# Patient Record
Sex: Female | Born: 1955 | Race: White | Hispanic: No | Marital: Single | Smoking: Former smoker
Health system: Southern US, Community
[De-identification: ages and names within clinical notes are randomized; demographics above are authoritative.]

## PROBLEM LIST (undated history)

## (undated) DIAGNOSIS — J449 Chronic obstructive pulmonary disease, unspecified: Secondary | ICD-10-CM

## (undated) HISTORY — PX: APPENDECTOMY: SHX54

---

## 2010-03-01 ENCOUNTER — Encounter: Payer: Self-pay | Admitting: Pulmonary Disease

## 2010-03-29 ENCOUNTER — Ambulatory Visit: Payer: Self-pay | Admitting: Pulmonary Disease

## 2010-03-29 DIAGNOSIS — E785 Hyperlipidemia, unspecified: Secondary | ICD-10-CM

## 2010-03-29 DIAGNOSIS — J4489 Other specified chronic obstructive pulmonary disease: Secondary | ICD-10-CM | POA: Insufficient documentation

## 2010-03-29 DIAGNOSIS — J209 Acute bronchitis, unspecified: Secondary | ICD-10-CM

## 2010-03-29 DIAGNOSIS — J449 Chronic obstructive pulmonary disease, unspecified: Secondary | ICD-10-CM

## 2010-03-29 DIAGNOSIS — J45909 Unspecified asthma, uncomplicated: Secondary | ICD-10-CM | POA: Insufficient documentation

## 2010-03-29 DIAGNOSIS — E119 Type 2 diabetes mellitus without complications: Secondary | ICD-10-CM

## 2010-11-22 NOTE — Assessment & Plan Note (Signed)
Summary: Cough, sob, restrictive lung ds./in [facility] office/apc   Visit Type:  Initial Consult Copy to:  PCP Primary Provider/Referring Provider:  Dr. [facility]  CC:  Pt c/o productive cough with dark green mucus first thing in the AM, thick and clear after taking neb treatment, and once starts to cough pt has to continue to cough due to chest congestion.  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 Onset was with URI symptoms progressing to chest congestion, dyspnea & chest tightness. She had 5 urgent care visits -PF noted to be 100, spirometry showed FEv of 31% - told that she had COPD, augmentin, solumedrol 125 mg IM given & finally starfted on xopenex & atrovent nebs. Metformin controls her sugars -125 range. She now has clear phlegm, sometmes green in am, nasal stufiness, residual cough & wheezing. labs showed WC 7.1, Hb 12.4 CXR 03/01/10 mild hyperinflation, no infx (reviewed images) She denies seasonal allergies.  Preventive Screening-Counseling & Management  Alcohol-Tobacco     Smoking Status: quit     Packs/Day: 0.25     Year Started: 1972     Year Quit: 1999  Current Medications (verified): 1)  Aspirin 81 Mg  Tabs (Aspirin) .... Take 1 Tablet By Mouth Once A Day 2)  Lipitor 20 Mg Tabs (Atorvastatin Calcium) .... Take 1 Tablet By Mouth Once A Day 3)  Metformin Hcl 500 Mg Tabs (Metformin Hcl) .... Take 1 Tab By Mouth At Bedtime 4)  Xopenex 1.25 Mg/78ml Nebu (Levalbuterol Hcl) .... Inhale 1 Vial Via Nebulizer Every 6 Hours 5)  Ipratropium Bromide 0.02 % Soln (Ipratropium Bromide) .... Inhale 1 Vial Via Nebulizer Every 6 Hours  Allergies (verified): No Known Drug Allergies  Past History:  Past Medical History: HYPERLIPIDEMIA (ICD-272.4) DIABETES, TYPE 2 (ICD-250.00) C O P D (ICD-496) ASTHMA (ICD-493.90)    Past Surgical History: none  Family History: Family History Asthma-son Family  History MI/Heart Attack-mother Family History Rheumatoid Arthritis-mother Cancer-paternal  Social History: Marital Status: divorced, lives with daughter Children: yes Occupation: Runner, broadcasting/film/video Patient states former smoker. - quit '99, smoked socially Smoking Status:  quit Packs/Day:  0.25  Review of Systems       The patient complains of shortness of breath with activity, productive cough, indigestion, weight change, headaches, anxiety, depression, joint stiffness or pain, and change in color of mucus.  The patient denies shortness of breath at rest, non-productive cough, coughing up blood, chest pain, irregular heartbeats, acid heartburn, loss of appetite, abdominal pain, difficulty swallowing, sore throat, tooth/dental problems, nasal congestion/difficulty breathing through nose, sneezing, itching, ear ache, hand/feet swelling, rash, and fever.    Vital Signs:  Patient profile:   55 year old female Height:      62 inches Weight:      158 pounds BMI:     29.00 O2 Sat:      93 % Temp:     98.1 degrees F oral Pulse rate:   94 / minute BP sitting:   118 / 86  (left arm) Cuff size:   regular  Vitals Entered By: Zackery Barefoot CMA (March 29, 2010 10:37 AM) CC: Pt c/o productive cough with dark green mucus first thing in the AM, thick and clear after taking neb treatment, once starts to cough pt has to continue to cough due to chest congestion   Physical Exam  Additional Exam:  Gen. Pleasant, well-nourished, in no distress, normal affect, intermittent coughing ENT - no lesions,  no post nasal drip Neck: No JVD, no thyromegaly, no carotid bruits Lungs: no use of accessory muscles, no dullness to percussion, clear without rales or rhonchi  Cardiovascular: Rhythm regular, heart sounds  normal, no murmurs or gallops, no peripheral edema Abdomen: soft and non-tender, no hepatosplenomegaly, BS normal. Musculoskeletal: No deformities, no cyanosis or clubbing Neuro:  alert, non focal      Impression & Recommendations:  Problem # 1:  BRONCHITIS, ACUTE (ICD-466.0)  Likely post bronchitic cough & wheezing. No need for more ABx. Short course of steroids- advised CBG monitoring. ct xopenex/ atrovent nebs q 6h as needed   Her updated medication list for this problem includes:    Xopenex 1.25 Mg/86ml Nebu (Levalbuterol hcl) ..... Inhale 1 vial via nebulizer every 6 hours    Ipratropium Bromide 0.02 % Soln (Ipratropium bromide) ..... 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 Would be interesting to repeat spirometry a few months after symptom resolution to check FEV1.  Medications Added to Medication List This Visit: 1)  Aspirin 81 Mg Tabs (Aspirin) .... Take 1 tablet by mouth once a day 2)  Lipitor 20 Mg Tabs (Atorvastatin calcium) .... Take 1 tablet by mouth once a day 3)  Metformin Hcl 500 Mg Tabs (Metformin hcl) .... Take 1 tab by mouth at bedtime 4)  Xopenex 1.25 Mg/34ml Nebu (Levalbuterol hcl) .... Inhale 1 vial via nebulizer every 6 hours 5)  Ipratropium Bromide 0.02 % Soln (Ipratropium bromide) .... Inhale 1 vial via nebulizer every 6 hours 6)  Prednisone 10 Mg Tabs (Prednisone) .... Take 2 tabs once daily with food x 5 days , then 1 tab x 7 days then stop  Patient Instructions: 1)  Copy sent to: dr amin/ [facility] [place] 2)  Please schedule a follow-up appointment in 3 months. 3)  Call if sugars higher than 200 or if no better in 3 weeks 4)  Use claritin-D OR afrin nasal spray (decongestant) Prescriptions: PREDNISONE 10 MG TABS (PREDNISONE) take 2 tabs once daily with food x 5 days , then 1 tab x 7 days then STOP  #20 x 0   Entered and Authorized by:   Comer Locket Vassie Loll MD   Signed by:   Comer Locket Vassie Loll MD on 03/29/2010   Method used:   Electronically to        [id number]* (retail)       [address]       [place], [place]  [address]       Ph: [phone]       Fax: [phone]   RxID:   [id number]

## 2012-02-01 ENCOUNTER — Emergency Department (INDEPENDENT_AMBULATORY_CARE_PROVIDER_SITE_OTHER): Payer: Managed Care, Other (non HMO)

## 2012-02-01 ENCOUNTER — Emergency Department (HOSPITAL_BASED_OUTPATIENT_CLINIC_OR_DEPARTMENT_OTHER): Payer: Managed Care, Other (non HMO)

## 2012-02-01 ENCOUNTER — Encounter (HOSPITAL_BASED_OUTPATIENT_CLINIC_OR_DEPARTMENT_OTHER): Payer: Self-pay | Admitting: Emergency Medicine

## 2012-02-01 ENCOUNTER — Emergency Department (HOSPITAL_BASED_OUTPATIENT_CLINIC_OR_DEPARTMENT_OTHER)
Admission: EM | Admit: 2012-02-01 | Discharge: 2012-02-01 | Disposition: A | Discharge status: Home / Self Care | Payer: Managed Care, Other (non HMO) | Attending: Emergency Medicine | Admitting: Emergency Medicine

## 2012-02-01 DIAGNOSIS — R0602 Shortness of breath: Secondary | ICD-10-CM

## 2012-02-01 DIAGNOSIS — M549 Dorsalgia, unspecified: Secondary | ICD-10-CM

## 2012-02-01 DIAGNOSIS — K8020 Calculus of gallbladder without cholecystitis without obstruction: Secondary | ICD-10-CM

## 2012-02-01 DIAGNOSIS — R21 Rash and other nonspecific skin eruption: Secondary | ICD-10-CM | POA: Insufficient documentation

## 2012-02-01 DIAGNOSIS — R079 Chest pain, unspecified: Secondary | ICD-10-CM

## 2012-02-01 DIAGNOSIS — R42 Dizziness and giddiness: Secondary | ICD-10-CM | POA: Insufficient documentation

## 2012-02-01 DIAGNOSIS — Z79899 Other long term (current) drug therapy: Secondary | ICD-10-CM | POA: Insufficient documentation

## 2012-02-01 DIAGNOSIS — R918 Other nonspecific abnormal finding of lung field: Secondary | ICD-10-CM

## 2012-02-01 DIAGNOSIS — I959 Hypotension, unspecified: Secondary | ICD-10-CM

## 2012-02-01 DIAGNOSIS — K5730 Diverticulosis of large intestine without perforation or abscess without bleeding: Secondary | ICD-10-CM

## 2012-02-01 DIAGNOSIS — E119 Type 2 diabetes mellitus without complications: Secondary | ICD-10-CM | POA: Insufficient documentation

## 2012-02-01 DIAGNOSIS — J4489 Other specified chronic obstructive pulmonary disease: Secondary | ICD-10-CM | POA: Insufficient documentation

## 2012-02-01 DIAGNOSIS — J449 Chronic obstructive pulmonary disease, unspecified: Secondary | ICD-10-CM | POA: Insufficient documentation

## 2012-02-01 HISTORY — DX: Chronic obstructive pulmonary disease, unspecified: J44.9

## 2012-02-01 LAB — CBC
HCT: 37.5 % (ref 36.0–46.0)
MCHC: 33.3 g/dL (ref 30.0–36.0)
Platelets: 214 10*3/uL (ref 150–400)
RDW: 12.3 % (ref 11.5–15.5)
WBC: 12.1 10*3/uL — ABNORMAL HIGH (ref 4.0–10.5)

## 2012-02-01 LAB — BASIC METABOLIC PANEL
Chloride: 102 mEq/L (ref 96–112)
GFR calc Af Amer: >90 mL/min (ref >90)
GFR calc non Af Amer: 80 mL/min — ABNORMAL LOW (ref >90)
Potassium: 3.8 mEq/L (ref 3.5–5.1)
Sodium: 138 mEq/L (ref 135–145)

## 2012-02-01 LAB — TROPONIN I
Troponin I: <0.3 ng/mL (ref <0.30)
Troponin I: <0.3 ng/mL (ref <0.30)

## 2012-02-01 LAB — HEPATIC FUNCTION PANEL
AST: 54 U/L — ABNORMAL HIGH (ref 0–37)
Bilirubin, Direct: 0.2 mg/dL (ref 0.0–0.3)
Indirect Bilirubin: 0.3 mg/dL (ref 0.3–0.9)
Total Bilirubin: 0.5 mg/dL (ref 0.3–1.2)

## 2012-02-01 LAB — LIPASE, BLOOD: Lipase: 19 U/L (ref 11–59)

## 2012-02-01 LAB — GLUCOSE, CAPILLARY: Glucose-Capillary: 112 mg/dL — ABNORMAL HIGH (ref 70–99)

## 2012-02-01 MED ORDER — FENTANYL CITRATE 0.05 MG/ML IJ SOLN
50.0000 ug | Freq: Once | INTRAMUSCULAR | Status: AC
  Administered 2012-02-01: 50 ug via INTRAVENOUS
  Filled 2012-02-01: qty 2

## 2012-02-01 MED ORDER — IOHEXOL 350 MG/ML SOLN
100.0000 mL | Freq: Once | INTRAVENOUS | Status: AC | PRN
  Administered 2012-02-01: 100 mL via INTRAVENOUS

## 2012-02-01 MED ORDER — SODIUM CHLORIDE 0.9 % IV BOLUS (SEPSIS)
1000.0000 mL | Freq: Once | INTRAVENOUS | Status: AC
  Administered 2012-02-01: 1000 mL via INTRAVENOUS

## 2012-02-01 MED ORDER — MORPHINE SULFATE 4 MG/ML IJ SOLN
4.0000 mg | Freq: Once | INTRAMUSCULAR | Status: AC
  Administered 2012-02-01: 4 mg via INTRAVENOUS
  Filled 2012-02-01: qty 1

## 2012-02-01 MED ORDER — KETOROLAC TROMETHAMINE 30 MG/ML IJ SOLN
30.0000 mg | Freq: Once | INTRAMUSCULAR | Status: AC
  Administered 2012-02-01: 30 mg via INTRAVENOUS
  Filled 2012-02-01: qty 1

## 2012-02-01 MED ORDER — IBUPROFEN 600 MG PO TABS: 600.0000 mg | ORAL_TABLET | Freq: Three times a day (TID) | ORAL | Status: AC | PRN

## 2012-02-01 MED ORDER — OXYCODONE-ACETAMINOPHEN 5-325 MG PO TABS: 1.0000 | ORAL_TABLET | ORAL | Status: AC | PRN

## 2012-02-01 NOTE — ED Notes (Signed)
Pt returned from ct

## 2012-02-01 NOTE — ED Notes (Signed)
Pt ambulated in hallway without difficulty

## 2012-02-01 NOTE — ED Notes (Signed)
MD at bedside. 

## 2012-02-01 NOTE — ED Notes (Signed)
Patient transported to CT 1030

## 2012-02-01 NOTE — ED Notes (Addendum)
Pt c/o of mid sternal chest pain that began at 0345- States pain is midsternal,non-radiating in nature. C/o of SOB with some dizziness Pain increases with pressure applied to chest

## 2012-02-01 NOTE — ED Provider Notes (Signed)
History     CSN: [id number]  Arrival date & time 02/01/12  1308   First MD Initiated Contact with Patient 02/01/12 [phone]      Chief Complaint  Patient presents with  . Chest Pain     The history is provided by the patient and a relative.   the patient reports she began having chest pain that was substernal and radiated to her upper back at approximately 3:45 AM today.  The pain has been severe, midsternal, 10 out of 10 as cause some shortness of breath.  The pain has been constant.  She denies cough or congestion, fever or chills.  She denies unilateral leg or arm swelling.  She's had no recent long travel.  She has no prior history of coronary artery disease, DVT, pulmonary embolism.  Family reported she appeared pale at home.  She denies arm or jaw pain.  Her pain is worse with deep breathing and palpation of her chest.  She denies vomiting or diarrhea.  She denies abdominal pain.  She's never had pain similar to this before.  She does have a history of COPD and diabetes.  She reports this does not feel like her COPD exacerbations. her pain is severe at this time  Past Medical History  Diagnosis Date  . Diabetes mellitus   . COPD (chronic obstructive pulmonary disease)     Past Surgical History  Procedure Date  . Appendectomy     No family history on file.  History  Substance Use Topics  . Smoking status: Not on file  . Smokeless tobacco: Not on file  . Alcohol Use:     OB History    Grav Para Term Preterm Abortions TAB SAB Ect Mult Living                  Review of Systems  Cardiovascular: Positive for chest pain.  All other systems reviewed and are negative.    Allergies  Review of patient's allergies indicates no known allergies.  Home Medications   Current Outpatient Rx  Name Route Sig Dispense Refill  . ACETAMINOPHEN 500 MG PO TABS Oral Take 500 mg by mouth every 6 (six) hours as needed. Leg pain    . ALBUTEROL SULFATE HFA 108 (90 BASE) MCG/ACT IN AERS  Inhalation Inhale 2 puffs into the lungs every 6 (six) hours as needed. For wheezing    . ASPIRIN 81 MG PO TABS Oral Take 81 mg by mouth daily.    . ASPIRIN EFFERVESCENT 325 MG PO TBEF Oral Take 325 mg by mouth every 6 (six) hours as needed.    . ATORVASTATIN CALCIUM 20 MG PO TABS Oral Take 20 mg by mouth daily.    Marland Kitchen METFORMIN HCL ER (OSM) 500 MG PO TB24 Oral Take 500 mg by mouth daily.     Marland Kitchen PHENTERMINE HCL 30 MG PO CAPS Oral Take 30 mg by mouth every morning.    Marland Kitchen TIOTROPIUM BROMIDE MONOHYDRATE 18 MCG IN CAPS Inhalation Place 18 mcg into inhaler and inhale daily.      BP 91/43  Pulse 78  Resp 16  Ht 5\' 4"  (1.626 m)  Wt 130 lb (58.968 kg)  BMI 22.31 kg/m2  SpO2 98%  Physical Exam  Nursing note and vitals reviewed. Constitutional: She is oriented to person, place, and time. She appears well-developed and well-nourished. No distress.  HENT:  Head: Normocephalic and atraumatic.  Eyes: EOM are normal.  Neck: Normal range of motion.  Cardiovascular: Normal  rate, regular rhythm and normal heart sounds.   Pulmonary/Chest: Effort normal and breath sounds normal.  Abdominal: Soft. She exhibits no distension. There is no tenderness.  Musculoskeletal: Normal range of motion.  Neurological: She is alert and oriented to person, place, and time.  Skin: Skin is warm and dry.       There is a blanching rash to her upper abdomen without vesicles or surrounding erythema.  This involves both the right upper quadrant epigastric and left upper quadrant areas  Psychiatric: She has a normal mood and affect. Judgment normal.    ED Course  Procedures (including critical care time)   Date: 02/01/2012 0859  Rate: 60  Rhythm: normal sinus rhythm  QRS Axis: normal  Intervals: normal  ST/T Wave abnormalities: Nonspecific T wave changes  Conduction Disutrbances: none  Narrative Interpretation:   Old EKG Reviewed: No prior EKG available   Date: 02/01/2012 1405  Rate: 78  Rhythm: normal sinus  rhythm  QRS Axis: normal  Intervals: normal  ST/T Wave abnormalities: Nonspecific T wave changes   Conduction Disutrbances: none  Narrative Interpretation:   Old EKG Reviewed: No significant changes noted      Labs Reviewed  GLUCOSE, CAPILLARY - Abnormal; Notable for the following:    Glucose-Capillary 112 (*)    All other components within normal limits  CBC - Abnormal; Notable for the following:    WBC 12.1 (*)    All other components within normal limits  BASIC METABOLIC PANEL - Abnormal; Notable for the following:    Glucose, Bld 113 (*)    GFR calc non Af Amer 80 (*)    All other components within normal limits  HEPATIC FUNCTION PANEL - Abnormal; Notable for the following:    AST 54 (*)    All other components within normal limits  TROPONIN I  D-DIMER, QUANTITATIVE  TROPONIN I  LIPASE, BLOOD   Ct Angio Chest W/cm &/or Wo Cm  02/01/2012  *RADIOLOGY REPORT*  Clinical Data:  Chest pain, hypotensive  CT ANGIOGRAPHY CHEST, ABDOMEN AND PELVIS  Technique:  Multidetector CT imaging through the chest, abdomen and pelvis was performed using the standard protocol during bolus administration of intravenous contrast.  Multiplanar reconstructed images including MIPs were obtained and reviewed to evaluate the vascular anatomy.  Contrast: OMNIPAQUE IOHEXOL 350 MG/ML SOLN,  Comparison:   None.  CTA CHEST  Findings:  The noncontrast scan shows no hyperdense hematoma. There is good contrast opacification of the pulmonary artery branches. No discrete filling defect to suggest acute PE.There is good contrast opacification of the thoracic aorta, without evidence of dissection, aneurysm, or stenosis.  There is a common origin of the right brachiocephalic and left common carotid arteries, a normal variant.  No proximal brachiocephalic arterial origin stenosis.  No pleural or pericardial effusion.  No hilar or mediastinal adenopathy.  Sub centimeter prevascular and AP window lymph nodes are noted.   Emphysematous changes are noted in the upper lobes. There is some linear scarring laterally in the lateral basal segment right lower lobe.  Dependent atelectasis or alveolitis posterolaterally in the lower lobes, right greater than left.   Review of the MIP images confirms the above findings.  IMPRESSION:  1.  Negative for acute PE or thoracic aortic dissection. 2.  Emphysematous changes.  CTA ABDOMEN AND PELVIS  Arterial findings: Aorta:                  Normal in caliber and contour without atheromatous irregularity, aneurysm,  dissection, or stenosis.  Celiac axis:            Widely patent.  Superior mesenteric:Widely patent, with classic distal branching.  Left renal:             Single, widely patent  Right renal:            Duplicated, superior dominant, widely patent  Inferior mesenteric:Patent  Left iliac:             Unremarkable  Right iliac:            Unremarkable  .  Venous findings:  Dedicated venous imaging was not obtained. Patent hepatic veins are noted.   Review of the MIP images confirms the above findings.  Nonvascular findings: Multiple small partially calcified stones layer in the dependent aspect of the gallbladder.  Unremarkable arterial phase evaluation of the liver, spleen, adrenal glands, kidneys, pancreas.  Stomach is physiologically distended.  Small bowel is nondilated.  Appendix not discretely identified.  The colon is nondilated with scattered distal descending and sigmoid diverticula.  No adjacent inflammatory/edematous change.  Uterus and adnexal regions unremarkable.  Urinary bladder physiologically distended.  No ascites.  No free air.  Spondylitic changes L5-S1.  IMPRESSION: 1.  Negative for abdominal aortic aneurysm or dissection. 2.  Cholelithiasis. 3.  Descending and sigmoid diverticulosis.  Original Report Authenticated By: Osa Craver, M.D.   Dg Chest Portable 1 View  02/01/2012  *RADIOLOGY REPORT*  Clinical Data: Chest pain, back pain, shortness of breath,  dizziness, history of COPD  PORTABLE CHEST - 1 VIEW  Comparison: None.  Findings:  Normal cardiac silhouette and mediastinal contours.  There is mild diffuse peribronchial and interstitial thickening.  Mild cephalization of flow without frank evidence of pulmonary edema. Bibasilar heterogeneous opacities favored to represent atelectasis. No definite pleural effusion or pneumothorax.  Grossly unchanged bones.  IMPRESSION: 1.  Diffuse interstitial thickening most suggestive of chronic airways disease/bronchitis. 2.  Bibasilar opacities, favored to represent atelectasis.  Original Report Authenticated By: Waynard Reeds, M.D.   Ct Angio Abd/pel W/ And/or W/o  02/01/2012  *RADIOLOGY REPORT*  Clinical Data:  Chest pain, hypotensive  CT ANGIOGRAPHY CHEST, ABDOMEN AND PELVIS  Technique:  Multidetector CT imaging through the chest, abdomen and pelvis was performed using the standard protocol during bolus administration of intravenous contrast.  Multiplanar reconstructed images including MIPs were obtained and reviewed to evaluate the vascular anatomy.  Contrast: OMNIPAQUE IOHEXOL 350 MG/ML SOLN,  Comparison:   None.  CTA CHEST  Findings:  The noncontrast scan shows no hyperdense hematoma. There is good contrast opacification of the pulmonary artery branches. No discrete filling defect to suggest acute PE.There is good contrast opacification of the thoracic aorta, without evidence of dissection, aneurysm, or stenosis.  There is a common origin of the right brachiocephalic and left common carotid arteries, a normal variant.  No proximal brachiocephalic arterial origin stenosis.  No pleural or pericardial effusion.  No hilar or mediastinal adenopathy.  Sub centimeter prevascular and AP window lymph nodes are noted.  Emphysematous changes are noted in the upper lobes. There is some linear scarring laterally in the lateral basal segment right lower lobe.  Dependent atelectasis or alveolitis posterolaterally in the  lower lobes, right greater than left.   Review of the MIP images confirms the above findings.  IMPRESSION:  1.  Negative for acute PE or thoracic aortic dissection. 2.  Emphysematous changes.  CTA ABDOMEN AND PELVIS  Arterial findings: Aorta:  Normal in caliber and contour without atheromatous irregularity, aneurysm, dissection, or stenosis.  Celiac axis:            Widely patent.  Superior mesenteric:Widely patent, with classic distal branching.  Left renal:             Single, widely patent  Right renal:            Duplicated, superior dominant, widely patent  Inferior mesenteric:Patent  Left iliac:             Unremarkable  Right iliac:            Unremarkable  .  Venous findings:  Dedicated venous imaging was not obtained. Patent hepatic veins are noted.   Review of the MIP images confirms the above findings.  Nonvascular findings: Multiple small partially calcified stones layer in the dependent aspect of the gallbladder.  Unremarkable arterial phase evaluation of the liver, spleen, adrenal glands, kidneys, pancreas.  Stomach is physiologically distended.  Small bowel is nondilated.  Appendix not discretely identified.  The colon is nondilated with scattered distal descending and sigmoid diverticula.  No adjacent inflammatory/edematous change.  Uterus and adnexal regions unremarkable.  Urinary bladder physiologically distended.  No ascites.  No free air.  Spondylitic changes L5-S1.  IMPRESSION: 1.  Negative for abdominal aortic aneurysm or dissection. 2.  Cholelithiasis. 3.  Descending and sigmoid diverticulosis.  Original Report Authenticated By: Osa Craver, M.D.   I personally reviewed the images  1. Chest pain       MDM  The patient had some transient hypotension on arrival.  Spread been vagal in nature.  However given her significant chest pain and pain radiating to the back a CT angiogram was obtained to evaluate for dissection.  Ports and this was a study that both  allow for evaluation for aortic dissection as well as pulmonary embolism neither of which were present.  She's had EKG x2 which are both without significant abnormality.  She's had 2 troponins in the emergency department which have been normal at this time she has had constant discomfort for coming up on 12 hours.  Her pain is improved here with Toradol and morphine although she still has some pain which takes a deep breath.  She has no rash to suggest zoster.  I don't believe this to be ACS.  This is likely musculoskeletal versus pleurisy.  Her labs are otherwise without significant abnormality.  Incidental cholelithiasis was noted however the patient has no right upper quadrant tenderness there is no signs of cholecystitis on CT scan.  Her LFTs are without significant abnormality no lipase is normal.  This can be evaluated further on an elective basis as an outpatient.  The patient will be discharged home with anti-inflammatory medicines and pain medicines.  She's been instructed to return the emergency department for any new or worsening symptoms.  She walked around the emergency department and ambulated without difficulty.  She reports she feels much better.        Lyanne Co, MD 02/01/12 [phone]

## 2012-02-01 NOTE — Discharge Instructions (Signed)
Chest Pain (Nonspecific) It is often hard to give a specific diagnosis for the cause of chest pain. There is always a chance that your pain could be related to something serious, such as a heart attack or a blood clot in the lungs. You need to follow up with your caregiver for further evaluation. CAUSES   Heartburn.   Pneumonia or bronchitis.   Anxiety or stress.   Inflammation around your heart (pericarditis) or lung (pleuritis or pleurisy).   A blood clot in the lung.   A collapsed lung (pneumothorax). It can develop suddenly on its own (spontaneous pneumothorax) or from injury (trauma) to the chest.   Shingles infection (herpes zoster virus).  The chest wall is composed of bones, muscles, and cartilage. Any of these can be the source of the pain.  The bones can be bruised by injury.   The muscles or cartilage can be strained by coughing or overwork.   The cartilage can be affected by inflammation and become sore (costochondritis).  DIAGNOSIS  Lab tests or other studies, such as X-rays, electrocardiography, stress testing, or cardiac imaging, may be needed to find the cause of your pain.  TREATMENT   Treatment depends on what may be causing your chest pain. Treatment may include:   Acid blockers for heartburn.   Anti-inflammatory medicine.   Pain medicine for inflammatory conditions.   Antibiotics if an infection is present.   You may be advised to change lifestyle habits. This includes stopping smoking and avoiding alcohol, caffeine, and chocolate.   You may be advised to keep your head raised (elevated) when sleeping. This reduces the chance of acid going backward from your stomach into your esophagus.   Most of the time, nonspecific chest pain will improve within 2 to 3 days with rest and mild pain medicine.  HOME CARE INSTRUCTIONS   If antibiotics were prescribed, take your antibiotics as directed. Finish them even if you start to feel better.   For the next few  days, avoid physical activities that bring on chest pain. Continue physical activities as directed.   Do not smoke.   Avoid drinking alcohol.   Only take over-the-counter or prescription medicine for pain, discomfort, or fever as directed by your caregiver.   Follow your caregiver's suggestions for further testing if your chest pain does not go away.   Keep any follow-up appointments you made. If you do not go to an appointment, you could develop lasting (chronic) problems with pain. If there is any problem keeping an appointment, you must call to reschedule.  SEEK MEDICAL CARE IF:   You think you are having problems from the medicine you are taking. Read your medicine instructions carefully.   Your chest pain does not go away, even after treatment.   You develop a rash with blisters on your chest.  SEEK IMMEDIATE MEDICAL CARE IF:   You have increased chest pain or pain that spreads to your arm, neck, jaw, back, or abdomen.   You develop shortness of breath, an increasing cough, or you are coughing up blood.   You have severe back or abdominal pain, feel nauseous, or vomit.   You develop severe weakness, fainting, or chills.   You have a fever.  THIS IS AN EMERGENCY. Do not wait to see if the pain will go away. Get medical help at once. Call your local emergency services (911 in [place] Do not drive yourself to the hospital. MAKE SURE YOU:   Understand these instructions.     Will watch your condition.   Will get help right away if you are not doing well or get worse.  Document Released: 07/19/2005 Document Revised: 09/28/2011 Document Reviewed: 05/14/2008 [employer] Patient Information 2012 [employer]. 

## 2012-02-01 NOTE — ED Notes (Signed)
Secondary Assessment- skin is cool petechial rash noted on mid abdomen.  Pt denies abdominal pain, however reports chest pain 10/10

## 2017-11-23 ENCOUNTER — Emergency Department (HOSPITAL_BASED_OUTPATIENT_CLINIC_OR_DEPARTMENT_OTHER): Payer: Commercial Managed Care - PPO

## 2017-11-23 ENCOUNTER — Other Ambulatory Visit: Payer: Self-pay

## 2017-11-23 ENCOUNTER — Encounter (HOSPITAL_BASED_OUTPATIENT_CLINIC_OR_DEPARTMENT_OTHER): Payer: Self-pay

## 2017-11-23 ENCOUNTER — Emergency Department (HOSPITAL_BASED_OUTPATIENT_CLINIC_OR_DEPARTMENT_OTHER)
Admission: EM | Admit: 2017-11-23 | Discharge: 2017-11-23 | Disposition: A | Discharge status: Home / Self Care | Payer: Commercial Managed Care - PPO | Attending: Emergency Medicine | Admitting: Emergency Medicine

## 2017-11-23 DIAGNOSIS — J45909 Unspecified asthma, uncomplicated: Secondary | ICD-10-CM | POA: Diagnosis not present

## 2017-11-23 DIAGNOSIS — R61 Generalized hyperhidrosis: Secondary | ICD-10-CM | POA: Insufficient documentation

## 2017-11-23 DIAGNOSIS — R55 Syncope and collapse: Secondary | ICD-10-CM | POA: Diagnosis not present

## 2017-11-23 DIAGNOSIS — R0902 Hypoxemia: Secondary | ICD-10-CM | POA: Diagnosis not present

## 2017-11-23 DIAGNOSIS — Z79899 Other long term (current) drug therapy: Secondary | ICD-10-CM | POA: Insufficient documentation

## 2017-11-23 DIAGNOSIS — Z7984 Long term (current) use of oral hypoglycemic drugs: Secondary | ICD-10-CM | POA: Diagnosis not present

## 2017-11-23 DIAGNOSIS — R110 Nausea: Secondary | ICD-10-CM | POA: Diagnosis not present

## 2017-11-23 DIAGNOSIS — E119 Type 2 diabetes mellitus without complications: Secondary | ICD-10-CM | POA: Diagnosis not present

## 2017-11-23 DIAGNOSIS — J449 Chronic obstructive pulmonary disease, unspecified: Secondary | ICD-10-CM | POA: Insufficient documentation

## 2017-11-23 DIAGNOSIS — R42 Dizziness and giddiness: Secondary | ICD-10-CM | POA: Insufficient documentation

## 2017-11-23 DIAGNOSIS — Z7982 Long term (current) use of aspirin: Secondary | ICD-10-CM | POA: Diagnosis not present

## 2017-11-23 DIAGNOSIS — R1030 Lower abdominal pain, unspecified: Secondary | ICD-10-CM

## 2017-11-23 LAB — CBC WITH DIFFERENTIAL/PLATELET
Basophils Absolute: 0.1 10*3/uL (ref 0.0–0.1)
Basophils Relative: 1 %
EOS ABS: 0.2 10*3/uL (ref 0.0–0.7)
Eosinophils Relative: 2 %
HCT: 40.1 % (ref 36.0–46.0)
Hemoglobin: 13 g/dL (ref 12.0–15.0)
LYMPHS ABS: 2.4 10*3/uL (ref 0.7–4.0)
Lymphocytes Relative: 23 %
MCH: 27.3 pg (ref 26.0–34.0)
MCHC: 32.4 g/dL (ref 30.0–36.0)
MCV: 84.2 fL (ref 78.0–100.0)
MONO ABS: 0.6 10*3/uL (ref 0.1–1.0)
Monocytes Relative: 5 %
Neutro Abs: 7.2 10*3/uL (ref 1.7–7.7)
Neutrophils Relative %: 69 %
PLATELETS: 284 10*3/uL (ref 150–400)
RBC: 4.76 MIL/uL (ref 3.87–5.11)
RDW: 13.1 % (ref 11.5–15.5)
WBC: 10.3 10*3/uL (ref 4.0–10.5)

## 2017-11-23 LAB — LIPASE, BLOOD: LIPASE: 21 U/L (ref 11–51)

## 2017-11-23 LAB — COMPREHENSIVE METABOLIC PANEL
ALT: 20 U/L (ref 14–54)
ANION GAP: 11 (ref 5–15)
AST: 20 U/L (ref 15–41)
Albumin: 3.9 g/dL (ref 3.5–5.0)
Alkaline Phosphatase: 54 U/L (ref 38–126)
BUN: 17 mg/dL (ref 6–20)
CHLORIDE: 102 mmol/L (ref 101–111)
CO2: 25 mmol/L (ref 22–32)
Calcium: 9.4 mg/dL (ref 8.9–10.3)
Creatinine, Ser: 0.94 mg/dL (ref 0.44–1.00)
Glucose, Bld: 127 mg/dL — ABNORMAL HIGH (ref 65–99)
Potassium: 3.7 mmol/L (ref 3.5–5.1)
SODIUM: 138 mmol/L (ref 135–145)
Total Bilirubin: 0.4 mg/dL (ref 0.3–1.2)
Total Protein: 7.6 g/dL (ref 6.5–8.1)

## 2017-11-23 LAB — TROPONIN I

## 2017-11-23 LAB — I-STAT CG4 LACTIC ACID, ED: Lactic Acid, Venous: 1.34 mmol/L (ref 0.5–1.9)

## 2017-11-23 LAB — CBG MONITORING, ED: Glucose-Capillary: 119 mg/dL — ABNORMAL HIGH (ref 65–99)

## 2017-11-23 LAB — D-DIMER, QUANTITATIVE (NOT AT ARMC): D DIMER QUANT: 1.66 ug{FEU}/mL — AB (ref 0.00–0.50)

## 2017-11-23 MED ORDER — SODIUM CHLORIDE 0.9 % IV BOLUS (SEPSIS)
1000.0000 mL | Freq: Once | INTRAVENOUS | Status: AC
  Administered 2017-11-23: 1000 mL via INTRAVENOUS

## 2017-11-23 MED ORDER — METOCLOPRAMIDE HCL 5 MG/ML IJ SOLN
10.0000 mg | Freq: Once | INTRAMUSCULAR | Status: AC
  Administered 2017-11-23: 10 mg via INTRAVENOUS
  Filled 2017-11-23: qty 2

## 2017-11-23 MED ORDER — ONDANSETRON HCL 4 MG/2ML IJ SOLN
4.0000 mg | Freq: Once | INTRAMUSCULAR | Status: AC
  Administered 2017-11-23: 4 mg via INTRAVENOUS
  Filled 2017-11-23: qty 2

## 2017-11-23 MED ORDER — SODIUM CHLORIDE 0.9 % IV BOLUS (SEPSIS)
1000.0000 mL | Freq: Once | INTRAVENOUS | Status: AC
Start: 2017-11-23 — End: 2017-11-23
  Administered 2017-11-23: 1000 mL via INTRAVENOUS

## 2017-11-23 MED ORDER — IOPAMIDOL (ISOVUE-370) INJECTION 76%
125.0000 mL | Freq: Once | INTRAVENOUS | Status: AC | PRN
  Administered 2017-11-23: 125 mL via INTRAVENOUS

## 2017-11-23 MED ORDER — METOCLOPRAMIDE HCL 10 MG PO TABS: 10.0000 mg | ORAL_TABLET | Freq: Four times a day (QID) | ORAL | 0 refills | Status: AC | PRN

## 2017-11-23 NOTE — ED Notes (Signed)
Oxygen saturation @ 96 % on 2L via [place]

## 2017-11-23 NOTE — ED Notes (Signed)
Pt on cardiac monitor and auto VS 

## 2017-11-23 NOTE — ED Notes (Signed)
Alert, NAD, calm, interactive, resps e/u, speaking in clear complete sentences, no dyspnea noted, skin W&D, VSS, (denies: pain, sob, nausea, dizziness or visual changes). [facility] and discuss results and [facility] plan[facility]

## 2017-11-23 NOTE — ED Notes (Signed)
ED Provider at bedside, Dr. Plunkett.  

## 2017-11-23 NOTE — ED Triage Notes (Signed)
Patient had vagal response in triage.  Placed in stretcher and taken to room 14.  Patient diaphoretic and flushed.

## 2017-11-23 NOTE — ED Triage Notes (Signed)
Pt states she was at work approx 45 min PTA had sudden onset abd cramping-went to [facility]-had formed BM-felt like she was going to pass out-denies LOC-EMS was called-pt refused EMS transport-family member drove pt

## 2017-11-23 NOTE — ED Notes (Signed)
Pt presented to triage in w/c-talking/answering all ?s she then stated she felt like she was going to pass out-became pale, diaphoretic, head slumped to chest-pt slow to respond-HR dropped to 40s-pt alert enough to stand when asked and sit on stretcher-taken to tx room via stretcher

## 2017-11-23 NOTE — ED Provider Notes (Signed)
[facility] EMERGENCY DEPARTMENT Provider Note   CSN: [id number] Arrival date & time: 11/23/17  1432     History   Chief Complaint Chief Complaint  Patient presents with  . Abdominal Pain    HPI Erika Rodgers is a 62 y.o. female.  The history is provided by the patient.  Abdominal Pain   This is a new problem. The current episode started 1 to 2 hours ago. The problem occurs constantly. The problem has been resolved. The pain is associated with an unknown factor. The pain is located in the suprapubic region. The quality of the pain is aching and cramping. The pain is at a severity of 8/10. The pain is severe. Associated symptoms include nausea. Pertinent negatives include fever, diarrhea, vomiting, constipation and headaches. Associated symptoms comments: States after the pain started she walked down to the bathroom when she started to feel lightheaded.  She knew she might pass out so she sat down and broke out in a sweat and felt nauseated.  This improved but then happened again in the waiting room and that time she did loose consciousness.  Pt states the abd pain is completely gone after she had a normal BM at work.  Currently only having some nausea but O2 sat low in the WR and pt states hx of recent med changes due to low oxygen.  . Exacerbated by: standing and walking. Relieved by: lying flat. Past medical history comments: hx of DM and COPD.  pt recently changed her meds to inhaled steroid.  also family hx positive for her dad having clots.    Past Medical History:  Diagnosis Date  . COPD (chronic obstructive pulmonary disease) (HCC)   . Diabetes mellitus     Patient Active Problem List   Diagnosis Date Noted  . DIABETES, TYPE 2 03/29/2010  . HYPERLIPIDEMIA 03/29/2010  . BRONCHITIS, ACUTE 03/29/2010  . ASTHMA 03/29/2010  . C O P D 03/29/2010    Past Surgical History:  Procedure Laterality Date  . APPENDECTOMY      OB History    No data available        Home Medications    Prior to Admission medications   Medication Sig Start Date End Date Taking? Authorizing Provider  UNKNOWN TO PATIENT Other-new inhaler   Yes [provider]  acetaminophen (TYLENOL) 500 MG tablet Take 500 mg by mouth every 6 (six) hours as needed. Leg pain    [provider]  albuterol (PROVENTIL HFA;VENTOLIN HFA) 108 (90 BASE) MCG/ACT inhaler Inhale 2 puffs into the lungs every 6 (six) hours as needed. For wheezing    [provider]  aspirin 81 MG tablet Take 81 mg by mouth daily.    [provider]  aspirin-sod bicarb-citric acid (ALKA-SELTZER) 325 MG TBEF Take 325 mg by mouth every 6 (six) hours as needed.    [provider]  metformin (FORTAMET) 500 MG (OSM) 24 hr tablet Take 500 mg by mouth daily.     [provider]    Family History No family history on file.  Social History Social History   Tobacco Use  . Smoking status: Former Games developer  . Smokeless tobacco: Never Used  Substance Use Topics  . Alcohol use: No    Frequency: Never  . Drug use: No     Allergies   Penicillins   Review of Systems Review of Systems  Constitutional: Negative for fever.  Gastrointestinal: Positive for abdominal pain and nausea. Negative for constipation,  diarrhea and vomiting.  Neurological: Negative for headaches.  All other systems reviewed and are negative.    Physical Exam Updated Vital Signs BP (!) 106/56   Pulse 60   Temp 97.7 F (36.5 C) (Oral)   Resp 16   SpO2 98%   Physical Exam  Constitutional: She is oriented to person, place, and time. She appears well-developed and well-nourished. No distress.  HENT:  Head: Normocephalic and atraumatic.  Mouth/Throat: Oropharynx is clear and moist.  Eyes: Conjunctivae and EOM are normal. Pupils are equal, round, and reactive to light.  Neck: Normal range of motion. Neck supple.  Cardiovascular: Normal rate, regular rhythm and intact distal pulses.   No murmur heard. Pulmonary/Chest: Effort normal and breath sounds normal. No respiratory distress. She has no wheezes. She has no rales.  Abdominal: Soft. Bowel sounds are normal. She exhibits no distension and no pulsatile midline mass. There is no tenderness. There is no rebound and no guarding. No hernia.  Musculoskeletal: Normal range of motion. She exhibits no edema or tenderness.  Neurological: She is alert and oriented to person, place, and time.  Skin: Skin is warm and dry. No rash noted. No erythema. There is pallor.  Psychiatric: She has a normal mood and affect. Her behavior is normal.  Nursing note and vitals reviewed.    ED Treatments / Results  Labs (all labs ordered are listed, but only abnormal results are displayed) Labs Reviewed  COMPREHENSIVE METABOLIC PANEL - Abnormal; Notable for the following components:      Result Value   Glucose, Bld 127 (*)    All other components within normal limits  D-DIMER, QUANTITATIVE (NOT AT [facility]) - Abnormal; Notable for the following components:   D-Dimer, Quant 1.66 (*)    All other components within normal limits  CBG MONITORING, ED - Abnormal; Notable for the following components:   Glucose-Capillary 119 (*)    All other components within normal limits  CBC WITH DIFFERENTIAL/PLATELET  LIPASE, BLOOD  TROPONIN I  I-STAT CG4 LACTIC ACID, ED    EKG  EKG Interpretation  Date/Time:  Friday November 23 2017 14:50:09 EST Ventricular Rate:  64 PR Interval:    QRS Duration: 97 QT Interval:  459 QTC Calculation: 474 R Axis:   -38 Text Interpretation:  Sinus rhythm Left axis deviation Abnormal R-wave progression, early transition Baseline wander in lead(s) III aVL V2 No significant change since last tracing Confirmed by Gwyneth Sprout ([id number]) on 11/23/2017 3:07:49 PM       Radiology Ct Angio Chest Pe W And/or Wo Contrast  Result Date: 11/23/2017 CLINICAL DATA:  80 mL isovue 370 delivered, bolus tracked, without complication  or reaction Pt states she was at work when suddenly her abdomen began to hurt, had BM, had near syncopal episode- 45 min PTA to ED, in triage had a syncopal episode in triage. Elevated D-dimer. Right shoulder pain. EXAM: CT ANGIOGRAPHY CHEST WITH CONTRAST TECHNIQUE: Multidetector CT imaging of the chest was performed using the standard protocol during bolus administration of intravenous contrast. Multiplanar CT image reconstructions and MIPs were obtained to evaluate the vascular anatomy. CONTRAST:  ISOVUE-370 IOPAMIDOL (ISOVUE-370) INJECTION 76% COMPARISON:  02/01/2012 FINDINGS: Cardiovascular: Heart size is normal. No pericardial effusion. Very minimal coronary artery atherosclerosis. The pulmonary arteries are well opacified by the contrast bolus. There is no acute pulmonary embolus. There is atherosclerotic calcification of the thoracic aorta not associated with aneurysm. Note is made of bovine arch anatomy. Mediastinum/Nodes: The visualized portion of the thyroid  gland has a normal appearance. Esophagus is normal in appearance. No mediastinal, hilar, or axillary adenopathy. Lungs/Pleura: There are centrilobular emphysematous changes with upper lobe predominance. Evaluation of the lung parenchyma is slightly degraded by patient motion artifact. No suspicious pulmonary nodules. No pleural effusions or pulmonary edema. There is scarring or atelectasis at the right lung base. Upper Abdomen: No acute abnormality. Musculoskeletal: Midthoracic spondylosis. No suspicious lytic or blastic lesions are identified. Review of the MIP images confirms the above findings. IMPRESSION: 1. Technically adequate exam showing no acute pulmonary embolus. 2.  Emphysema (ICD10-J43.9). 3.  Aortic Atherosclerosis (ICD10-I70.0). 4. Minimal coronary artery atherosclerosis. Electronically Signed   By: Norva PavlovElizabeth  Brown M.D.   On: 11/23/2017 16:27    Procedures Procedures (including critical care time)  Medications Ordered in  ED Medications  sodium chloride 0.9 % bolus 1,000 mL (1,000 mLs Intravenous New Bag/Given 11/23/17 1515)  ondansetron (ZOFRAN) injection 4 mg (4 mg Intravenous Given 11/23/17 1515)     Initial Impression / Assessment and Plan / ED Course  I have reviewed the triage vital signs and the nursing notes.  Pertinent labs & imaging results that were available during my care of the patient were reviewed by me and considered in my medical decision making (see chart for details).     Pt presenting today with initial lower abd pain and several episodes of near syncope.  Pt states she felt fine this morning and pain only started about 1 hour PTA.  Pt states it was a crampy intense pain and she felt like she needed to use the bathroom.  However when walking to the bathroom she developed near syncope.  She did have a normal BM and abd pain has now resolved however she continues to feel light headed and did have a syncopal event in the waiting room which seems to be vagal as pt HR dropped to 40 sinus bradycardia and BP dropped and after a short time she became back to baseline and HR/BP improved.  Pt denies any Abd pain now.  No CP but ongoing SOB which is not new and sees pulm as they think it is related to her COPD however her dad does have clots.   Pt given IVF with improvement in BP.  Labs reassuring with normal lipase, lfts and WBC.  No signs of anemia and EKG without acute findings.  Pt's d-dimer is elevated and with positive family hx will get CTA of chest to r/o clot.   Pt has no belly pain on exam and low suspicion for ruptured AAA, appy, diverticulitis, pancreatitis or hepatitis.  Pt does not have sx suggestive of KS and syncope was most likely vagal.  Lower suspicion for ACS however will get a delta trop.  Initial trop neg.  4:53 PM CT is neg.  Pt on re-eval now having some abd cramping and some diarrhea.  Most likely getting a virus.  Pt given reglan and will po challenge.  Also will ambulate and ensure  pt does not syncopize.  7:54 PM Delta trop neg.  Pt feeling much better.  Will d/c home. Final Clinical Impressions(s) / ED Diagnoses   Final diagnoses:  Vasovagal syncope  Lower abdominal pain    ED Discharge Orders        Ordered    metoCLOPramide (REGLAN) 10 MG tablet  Every 6 hours PRN     11/23/17 1953       Gwyneth SproutPlunkett, Tevion Laforge, MD 11/23/17 1955

## 2017-11-23 NOTE — ED Notes (Signed)
[facility]

## 2017-11-23 NOTE — ED Notes (Signed)
Patient transported to CT on cardiac monitor and RN accompanying her. Pt taken to room 4 after CT. Report given to Joss, Charity fundraiserN.

## 2017-11-23 NOTE — ED Notes (Signed)
Pt going to CT scan before moving to room 4

## 2019-09-02 IMAGING — CT CT ANGIO CHEST
2 of 8 series · 18 of 36 positions shown · IV contrast (isovue)
Comparison: 02/01/2012

CLINICAL DATA: 80 mL isovue 370 delivered, bolus tracked, without
complication or reaction Pt states she was at work when suddenly her
abdomen began to hurt, had BM, had near syncopal episode- 45 min PTA
to ED, in triage had a syncopal episode in triage. Elevated D-dimer.
Right shoulder pain.

EXAM:
CT ANGIOGRAPHY CHEST WITH CONTRAST
TECHNIQUE: Multidetector CT imaging of the chest was performed using the
standard protocol during bolus administration of intravenous
contrast. Multiplanar CT image reconstructions and MIPs were
obtained to evaluate the vascular anatomy.
CONTRAST:  125mL R8F9JL-6DS IOPAMIDOL (R8F9JL-6DS) INJECTION 76%

[Series 6: pe thins · axial · 0.67mm/px · z∈[-291,-26]mm · 17 of 297 slices shown]
[im 16/297  lung]
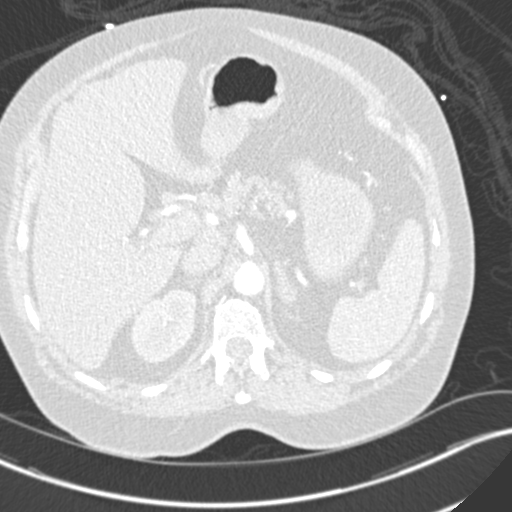
[im 32/297  mediastinal]
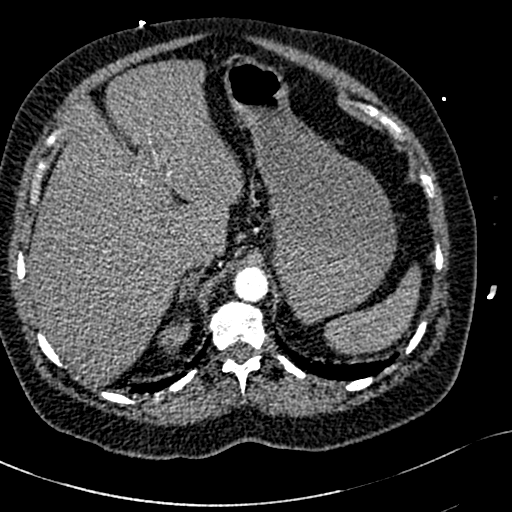
[im 47/297  lung]
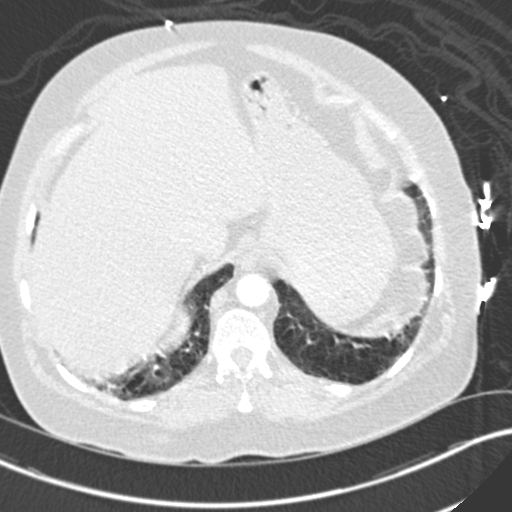
[im 63/297  mediastinal]
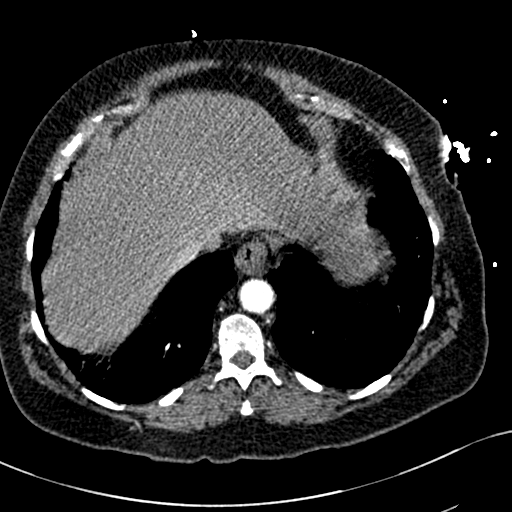
[im 78/297  lung]
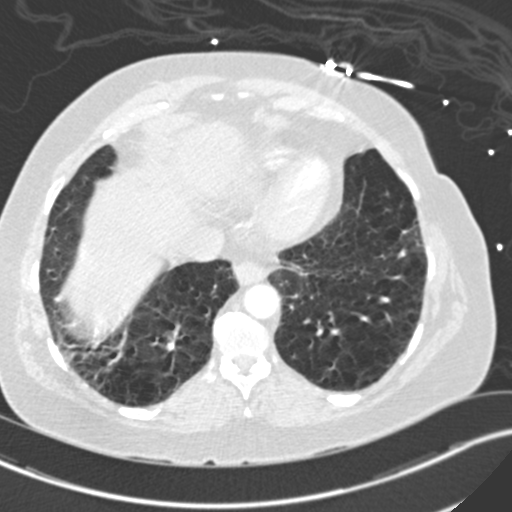
[im 94/297  mediastinal]
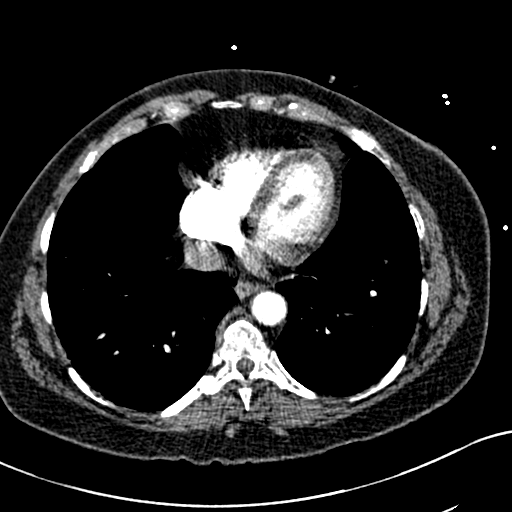
[im 110/297  lung]
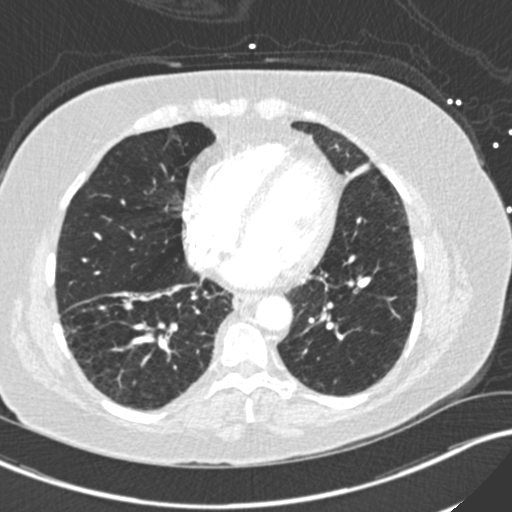
[im 125/297  mediastinal]
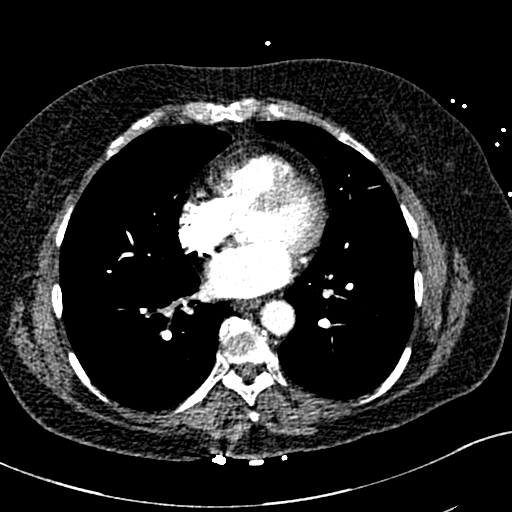
[im 156/297  lung]
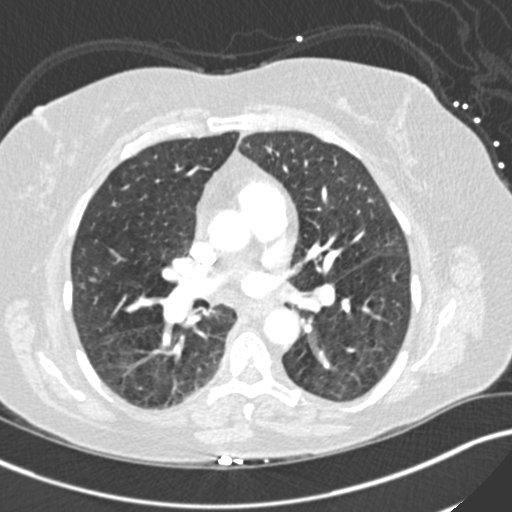
[im 172/297  mediastinal]
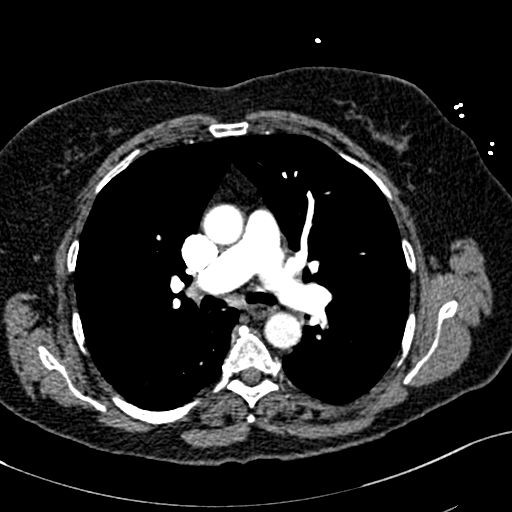
[im 187/297  lung]
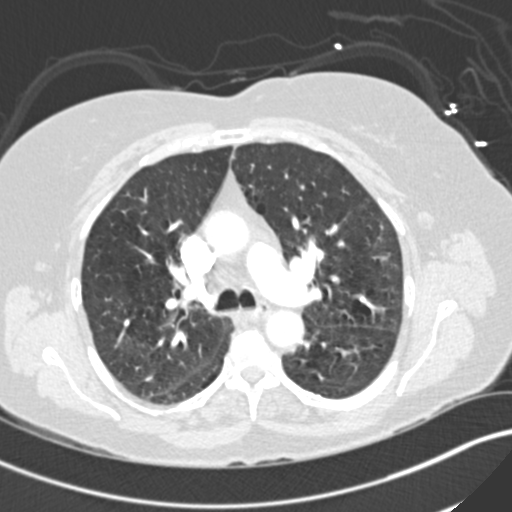
[im 203/297  mediastinal]
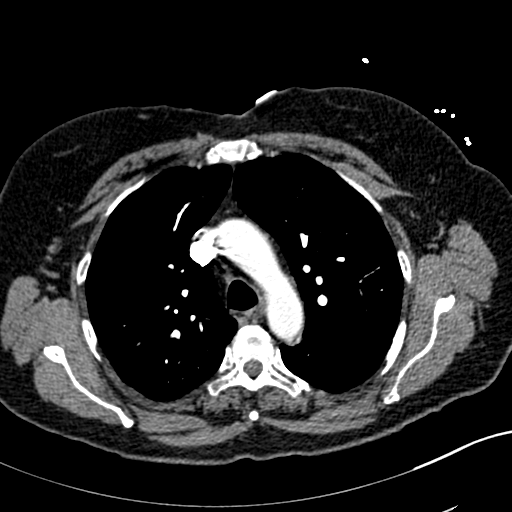
[im 219/297  lung]
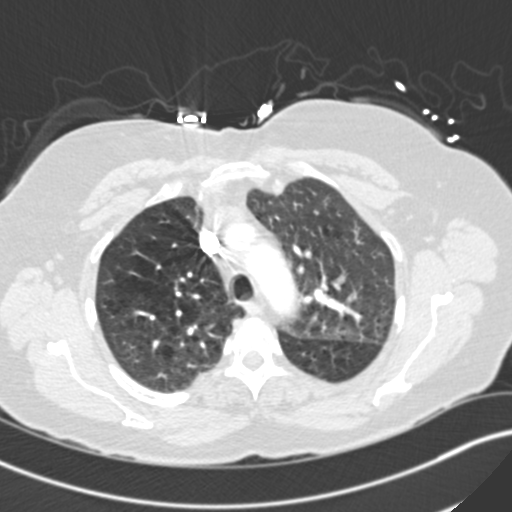
[im 234/297  mediastinal]
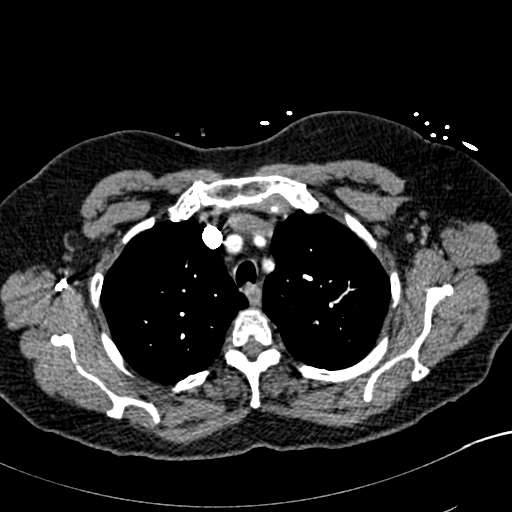
[im 250/297  lung]
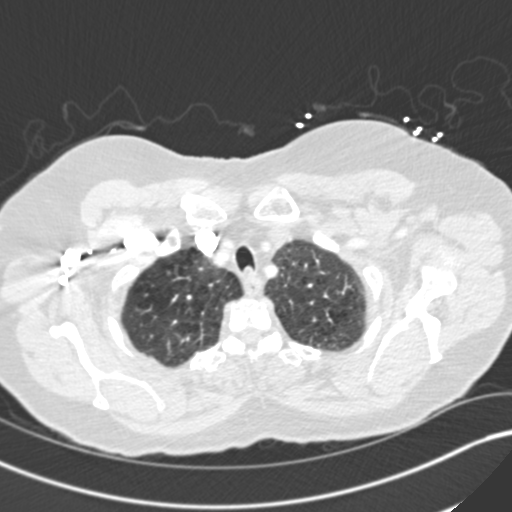
[im 265/297  mediastinal]
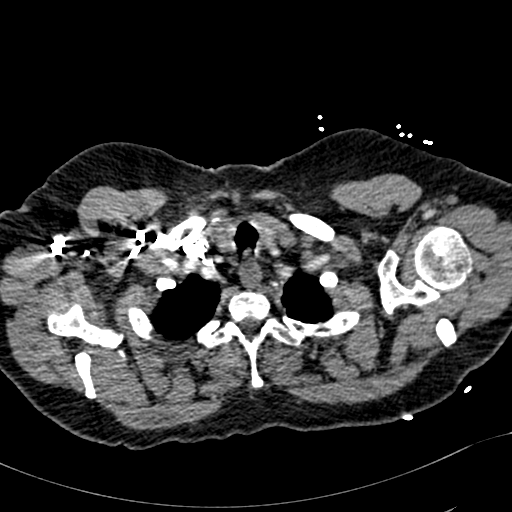
[im 281/297  lung]
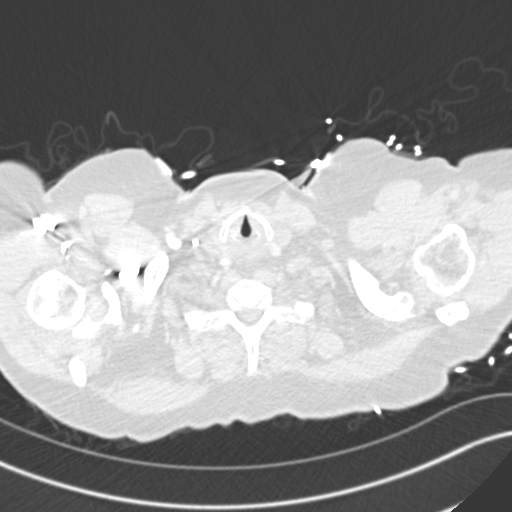

[Series 7: pe coronal mpr · coronal · 0.60mm/px · 1 of 151 slices shown]
[im 76/151  mediastinal]
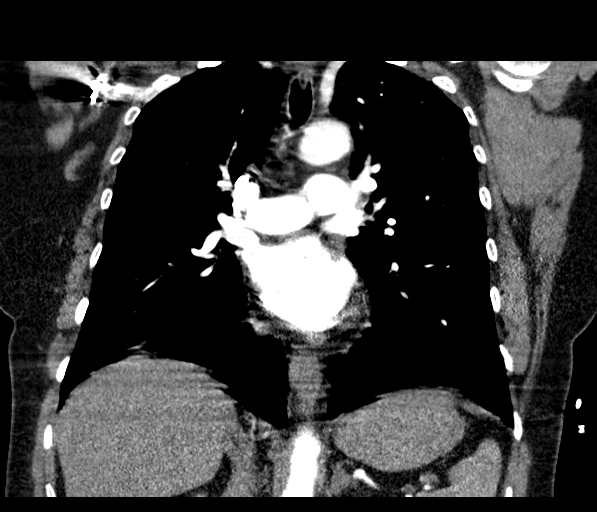

[18 of 36 positions shown; findings below may reference images not displayed]

FINDINGS: Cardiovascular: Heart size is normal. No pericardial effusion. Very
minimal coronary artery atherosclerosis. The pulmonary arteries are
well opacified by the contrast bolus. There is no acute pulmonary
embolus. There is atherosclerotic calcification of the thoracic
aorta not associated with aneurysm. Note is made of bovine arch
anatomy.

Mediastinum/Nodes: The visualized portion of the thyroid gland has a
normal appearance. Esophagus is normal in appearance. No
mediastinal, hilar, or axillary adenopathy.

Lungs/Pleura: There are centrilobular emphysematous changes with
upper lobe predominance. Evaluation of the lung parenchyma is
slightly degraded by patient motion artifact. No suspicious
pulmonary nodules. No pleural effusions or pulmonary edema. There is
scarring or atelectasis at the right lung base.

Upper Abdomen: No acute abnormality.

Musculoskeletal: Midthoracic spondylosis. No suspicious lytic or
blastic lesions are identified.

Review of the MIP images confirms the above findings.
IMPRESSION: 1. Technically adequate exam showing no acute pulmonary embolus.
2.  Emphysema (9SZUM-MG3.K).
3.  Aortic Atherosclerosis (9SZUM-GDP.P).
4. Minimal coronary artery atherosclerosis.
